# Patient Record
Sex: Female | Born: 2000 | Race: White | Hispanic: No | Marital: Single | Smoking: Never smoker
Health system: Southern US, Community
[De-identification: ages and names within clinical notes are randomized; demographics above are authoritative.]

## PROBLEM LIST (undated history)

## (undated) DIAGNOSIS — F909 Attention-deficit hyperactivity disorder, unspecified type: Secondary | ICD-10-CM

## (undated) DIAGNOSIS — J45909 Unspecified asthma, uncomplicated: Secondary | ICD-10-CM

## (undated) HISTORY — DX: Attention-deficit hyperactivity disorder, unspecified type: F90.9

---
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
## 2021-11-24 ENCOUNTER — Encounter (HOSPITAL_BASED_OUTPATIENT_CLINIC_OR_DEPARTMENT_OTHER): Payer: Self-pay | Admitting: Emergency Medicine

## 2021-11-24 ENCOUNTER — Emergency Department (HOSPITAL_BASED_OUTPATIENT_CLINIC_OR_DEPARTMENT_OTHER)
Admission: EM | Admit: 2021-11-24 | Discharge: 2021-11-24 | Disposition: A | Discharge status: Home / Self Care | Payer: BC Managed Care – PPO | Attending: Emergency Medicine | Admitting: Emergency Medicine

## 2021-11-24 ENCOUNTER — Other Ambulatory Visit: Payer: Self-pay

## 2021-11-24 DIAGNOSIS — R55 Syncope and collapse: Secondary | ICD-10-CM | POA: Insufficient documentation

## 2021-11-24 DIAGNOSIS — R000 Tachycardia, unspecified: Secondary | ICD-10-CM | POA: Diagnosis not present

## 2021-11-24 DIAGNOSIS — L559 Sunburn, unspecified: Secondary | ICD-10-CM | POA: Insufficient documentation

## 2021-11-24 DIAGNOSIS — R42 Dizziness and giddiness: Secondary | ICD-10-CM | POA: Insufficient documentation

## 2021-11-24 HISTORY — DX: Unspecified asthma, uncomplicated: J45.909

## 2021-11-24 NOTE — ED Triage Notes (Signed)
Pt has sunburn to both legs, chest and shoulders x 2 days.  Pt has improved but is unable to stand long periods due to burn.  Pt requesting work note to accommodate for decreased standing.

## 2021-11-24 NOTE — ED Notes (Signed)
ED Provider at bedside. 

## 2021-11-24 NOTE — Discharge Instructions (Signed)
Drink plenty of water the next few days to keep yourself hydrated at home.  Sweet teas, sodas, and other beverages have a lot of sugar, and will dehydrate you.

## 2021-11-24 NOTE — ED Provider Notes (Signed)
[facility] EMERGENCY DEPARTMENT Provider Note   CSN: [id number] Arrival date & time: 11/24/21  1036     History  Chief Complaint  Patient presents with   Sunburn    Robin Vasquez is a 21 y.o. female presented to the ER with a sunburn that occurred approximately 3 to 4 days ago [place].  She uses sunscreen with SPF 30 but slept too long.  She has a burn on her bilateral legs and her chest, sparing her face.  She reports that she drove herself home 5 hours from the beach without any issues, but was having significant pain in her legs, and then vomited a few times.  She has some episodes where she felt lightheaded.  She has also felt lightheaded in the past around stress and pain.  She denies any known history of cardiac disease or congenital cardiac issues or any other medical problems.  She is here requesting also a note for school, she is required to be on her feet for classes all day, and cannot do so due to pain.  She did try aloe cream at home but reports it made her skin "itch".  HPI     Home Medications Prior to Admission medications   Medication Sig Start Date End Date Taking? Authorizing Provider  methylphenidate (METADATE CD) 20 MG CR capsule Take 1 capsule (20 mg total) by mouth 2 (two) times daily. Fill after 12/10/11 Take one in the am and one at 1pm 12/10/11   Elaina Pattee, MD  risperiDONE (RISPERDAL) 0.25 MG tablet Take 1 tablet (0.25 mg total) by mouth 2 (two) times daily. 03/18/12   Elaina Pattee, MD      Allergies    Patient has no known allergies.    Review of Systems   Review of Systems  Physical Exam Updated Vital Signs BP 132/81   Pulse (!) 110   Temp 97.9 F (36.6 C) (Oral)   Resp 17   Ht 5\' 6"  (1.676 m)   Wt 50.8 kg   LMP 11/21/2021   SpO2 100%   BMI 18.08 kg/m  Physical Exam Constitutional:      General: She is not in acute distress. HENT:     Head: Normocephalic and atraumatic.  Eyes:      Conjunctiva/sclera: Conjunctivae normal.     Pupils: Pupils are equal, round, and reactive to light.  Cardiovascular:     Rate and Rhythm: Regular rhythm. Tachycardia present.  Pulmonary:     Effort: Pulmonary effort is normal. No respiratory distress.  Abdominal:     General: There is no distension.     Tenderness: There is no abdominal tenderness.  Skin:    General: Skin is warm and dry.     Comments: Sunburn to the bilateral legs, chest  Neurological:     General: No focal deficit present.     Mental Status: She is alert. Mental status is at baseline.  Psychiatric:        Mood and Affect: Mood normal.        Behavior: Behavior normal.     ED Results / Procedures / Treatments   Labs (all labs ordered are listed, but only abnormal results are displayed) Labs Reviewed - No data to display  EKG None  Radiology No results found.  Procedures Procedures    Medications Ordered in ED Medications - No data to display  ED Course/ Medical Decision Making/ A&P  Medical Decision Making  This is a well-appearing female here with sunburn that occurred 4 days ago.  She also reported to me that she had not been drinking any water yesterday, only sweet tea, and I explained that she may be dehydrated as well.  I strongly encouraged her to drink plenty of water at home.  This may explain her mild tachycardia, this may be a pain response.  I have a low suspicion for sepsis, Stevens-Johnson's, or other life-threatening illness.  I prescribed her Zofran as needed for nausea.  A work note was provided.  Recommended cool soaks, or to try aloe cream at home, does not sound like a true allergic reaction as she was describing to me, but likely some flaking and itching from her sunburn.  She verbalized understanding.  Okay for discharge  Her lightheaded episodes are very likely vasovagal episodes, given that there involves her stress and pain, and also that she not been  drinking a lot of water the past 2 days.  I do not believe she has an emergent life-threatening arrhythmia.        Final Clinical Impression(s) / ED Diagnoses Final diagnoses:  Sunburn  Near syncope    Rx / DC Orders ED Discharge Orders     None         Dariah Mcsorley, Kermit Balo, MD 11/24/21 1128
